# Patient Record
Sex: Female | Born: 1970 | Race: Black or African American | Hispanic: No
Health system: Southern US, Community
[De-identification: ages and names within clinical notes are randomized; demographics above are authoritative.]

---

## 2021-03-26 ENCOUNTER — Emergency Department (HOSPITAL_COMMUNITY)
Admission: EM | Admit: 2021-03-26 | Discharge: 2021-03-26 | Disposition: A | Discharge status: Home / Self Care | Payer: No Typology Code available for payment source | Attending: Emergency Medicine | Admitting: Emergency Medicine

## 2021-03-26 ENCOUNTER — Emergency Department (HOSPITAL_COMMUNITY): Payer: No Typology Code available for payment source

## 2021-03-26 ENCOUNTER — Other Ambulatory Visit: Payer: Self-pay

## 2021-03-26 DIAGNOSIS — S0990XA Unspecified injury of head, initial encounter: Secondary | ICD-10-CM | POA: Insufficient documentation

## 2021-03-26 DIAGNOSIS — Y92410 Unspecified street and highway as the place of occurrence of the external cause: Secondary | ICD-10-CM | POA: Insufficient documentation

## 2021-03-26 DIAGNOSIS — M25552 Pain in left hip: Secondary | ICD-10-CM | POA: Insufficient documentation

## 2021-03-26 DIAGNOSIS — M7918 Myalgia, other site: Secondary | ICD-10-CM

## 2021-03-26 DIAGNOSIS — M25561 Pain in right knee: Secondary | ICD-10-CM | POA: Diagnosis not present

## 2021-03-26 LAB — POC URINE PREG, ED: Preg Test, Ur: NEGATIVE

## 2021-03-26 MED ORDER — ACETAMINOPHEN 325 MG PO TABS
650.0000 mg | ORAL_TABLET | Freq: Once | ORAL | Status: AC
  Administered 2021-03-26: 650 mg via ORAL
  Filled 2021-03-26: qty 2

## 2021-03-26 MED ORDER — NAPROXEN 500 MG PO TABS: 500.0000 mg | ORAL_TABLET | Freq: Two times a day (BID) | ORAL | 0 refills | Status: AC

## 2021-03-26 MED ORDER — METHOCARBAMOL 500 MG PO TABS: 500.0000 mg | ORAL_TABLET | Freq: Two times a day (BID) | ORAL | 0 refills | Status: AC

## 2021-03-26 NOTE — Discharge Instructions (Addendum)
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
